# Patient Record
Sex: Male | Born: 1960 | Race: White | Hispanic: No | Smoking: Current every day smoker
Health system: Southern US, Community
[De-identification: ages and names within clinical notes are randomized; demographics above are authoritative.]

## PROBLEM LIST (undated history)

## (undated) DIAGNOSIS — M5126 Other intervertebral disc displacement, lumbar region: Secondary | ICD-10-CM

## (undated) DIAGNOSIS — J329 Chronic sinusitis, unspecified: Secondary | ICD-10-CM

## (undated) HISTORY — DX: Other intervertebral disc displacement, lumbar region: M51.26

## (undated) HISTORY — DX: Chronic sinusitis, unspecified: J32.9

## (undated) HISTORY — PX: NO PAST SURGERIES: SHX2092

---

## 1988-02-12 DIAGNOSIS — M5126 Other intervertebral disc displacement, lumbar region: Secondary | ICD-10-CM

## 1988-02-12 HISTORY — DX: Other intervertebral disc displacement, lumbar region: M51.26

## 2005-08-03 ENCOUNTER — Emergency Department: Payer: Self-pay | Admitting: Unknown Physician Specialty
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
## 2007-12-01 ENCOUNTER — Inpatient Hospital Stay: Payer: Self-pay | Admitting: Psychiatry

## 2010-03-16 ENCOUNTER — Emergency Department: Payer: Self-pay | Admitting: Emergency Medicine

## 2010-03-18 ENCOUNTER — Emergency Department: Payer: Self-pay | Admitting: Emergency Medicine

## 2010-06-10 ENCOUNTER — Emergency Department: Payer: Self-pay | Admitting: Emergency Medicine

## 2011-01-19 ENCOUNTER — Emergency Department: Payer: Self-pay | Admitting: Emergency Medicine
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
## 2017-02-13 ENCOUNTER — Ambulatory Visit: Payer: Self-pay | Admitting: Family Medicine

## 2017-02-13 VITALS — BP 141/78 | HR 88 | Temp 98.5°F | Wt 207.4 lb

## 2017-02-13 DIAGNOSIS — H538 Other visual disturbances: Secondary | ICD-10-CM

## 2017-02-13 DIAGNOSIS — H3530 Unspecified macular degeneration: Secondary | ICD-10-CM

## 2017-02-13 DIAGNOSIS — R030 Elevated blood-pressure reading, without diagnosis of hypertension: Secondary | ICD-10-CM

## 2017-02-13 NOTE — Progress Notes (Signed)
  Subjective:   Phillip Mccard. is a 57 y.o. 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  Past Medical History:  Diagnosis Date  . Lumbar herniated disc 1990  . Recurrent sinusitis     Past Surgical History:  Procedure Laterality Date  . NO PAST SURGERIES      Family History  Problem Relation Age of Onset  . Cancer Maternal Grandmother   . Diabetes Maternal Grandfather   . Epilepsy Son   . Diabetes Mother 59    Social History   Socioeconomic History  . Marital status: Divorced    Spouse name: Not on file  . Number of children: 4  . Years of education: Not on file  . Highest education level: Not on file  Social Needs  . Financial resource strain: Not on file  . Food insecurity - worry: Not on file  . Food insecurity - inability: Not on file  . Transportation needs - medical: Not on file  . Transportation needs - non-medical: Not on file  Occupational History  . Occupation: Temp Job  Tobacco Use  . Smoking status: Current Every Day Smoker    Packs/day: 1.00    Years: 45.00    Pack years: 45.00    Types: Cigarettes    Start date: 02/14/1972  . Smokeless tobacco: Never Used  Substance and Sexual Activity  . Alcohol use: No    Frequency: Never  . Drug use: No  . Sexual activity: Not Currently  Other Topics Concern  . Not on file  Social History Narrative  . Not on file     Objective:  BP (!) 141/78   Pulse 88   Temp 98.5 F (36.9 C)   Wt 207 lb 6.4 oz (94.1 kg)   Gen:  57 y.o. male in NAD HEENT: NCAT, MMM, EOMI, PERRL, anicteric sclerae CV: RRR, no MRG Resp: Non-labored, CTAB, no wheezes noted Ext: WWP, no edema MSK: Gait  intact, no obvious deformities Neuro: Alert and oriented, speech normal      Assessment & Plan:     Phillip Wu. is a 57 y.o. male here for establishing care  1. Elevated BP without diagnosis of hypertension - likely some degree of white coat syndrome in new setting - will continue to monitor - f/u in 6 months to recheck  2. Macular degeneration of left eye, unspecified type - will get him scheduled for Optho appt to reassess  3. Blurry vision, bilateral - Optho appt as above    Bacigalupo, Dionne Bucy, MD MPH  [facility]

## 2017-02-19 ENCOUNTER — Ambulatory Visit: Payer: Self-pay | Admitting: Ophthalmology

## 2017-08-19 ENCOUNTER — Ambulatory Visit: Payer: Self-pay

## 2017-09-09 ENCOUNTER — Ambulatory Visit: Payer: Self-pay

## 2018-01-14 ENCOUNTER — Telehealth: Payer: Self-pay | Admitting: Ophthalmology

## 2018-01-14 NOTE — Telephone Encounter (Signed)
Left VM about rescheduled eye appt on 02/19/2018 with Jefferson Fuel

## 2018-02-18 ENCOUNTER — Ambulatory Visit: Payer: Self-pay | Admitting: Ophthalmology

## 2018-02-19 ENCOUNTER — Ambulatory Visit: Payer: Self-pay | Admitting: Ophthalmology

## 2021-07-01 ENCOUNTER — Emergency Department: Payer: Self-pay

## 2021-07-01 ENCOUNTER — Inpatient Hospital Stay
Admission: EM | Admit: 2021-07-01 | Discharge: 2021-07-12 | DRG: 951 | Disposition: E | Payer: Self-pay | Attending: Internal Medicine | Admitting: Internal Medicine

## 2021-07-01 ENCOUNTER — Other Ambulatory Visit: Payer: Self-pay

## 2021-07-01 ENCOUNTER — Inpatient Hospital Stay: Payer: Self-pay

## 2021-07-01 DIAGNOSIS — C220 Liver cell carcinoma: Secondary | ICD-10-CM | POA: Diagnosis present

## 2021-07-01 DIAGNOSIS — Z66 Do not resuscitate: Secondary | ICD-10-CM | POA: Diagnosis present

## 2021-07-01 DIAGNOSIS — Z809 Family history of malignant neoplasm, unspecified: Secondary | ICD-10-CM

## 2021-07-01 DIAGNOSIS — C7802 Secondary malignant neoplasm of left lung: Secondary | ICD-10-CM | POA: Diagnosis present

## 2021-07-01 DIAGNOSIS — N179 Acute kidney failure, unspecified: Secondary | ICD-10-CM | POA: Diagnosis present

## 2021-07-01 DIAGNOSIS — K767 Hepatorenal syndrome: Secondary | ICD-10-CM | POA: Diagnosis present

## 2021-07-01 DIAGNOSIS — R9431 Abnormal electrocardiogram [ECG] [EKG]: Secondary | ICD-10-CM | POA: Diagnosis present

## 2021-07-01 DIAGNOSIS — R0602 Shortness of breath: Secondary | ICD-10-CM | POA: Diagnosis present

## 2021-07-01 DIAGNOSIS — K4090 Unilateral inguinal hernia, without obstruction or gangrene, not specified as recurrent: Secondary | ICD-10-CM | POA: Diagnosis present

## 2021-07-01 DIAGNOSIS — C7800 Secondary malignant neoplasm of unspecified lung: Secondary | ICD-10-CM | POA: Diagnosis present

## 2021-07-01 DIAGNOSIS — K7031 Alcoholic cirrhosis of liver with ascites: Secondary | ICD-10-CM | POA: Diagnosis present

## 2021-07-01 DIAGNOSIS — Z6826 Body mass index (BMI) 26.0-26.9, adult: Secondary | ICD-10-CM

## 2021-07-01 DIAGNOSIS — Z515 Encounter for palliative care: Principal | ICD-10-CM

## 2021-07-01 DIAGNOSIS — C7801 Secondary malignant neoplasm of right lung: Secondary | ICD-10-CM | POA: Diagnosis present

## 2021-07-01 DIAGNOSIS — F1011 Alcohol abuse, in remission: Secondary | ICD-10-CM | POA: Diagnosis present

## 2021-07-01 DIAGNOSIS — R627 Adult failure to thrive: Secondary | ICD-10-CM | POA: Diagnosis present

## 2021-07-01 DIAGNOSIS — Z20822 Contact with and (suspected) exposure to covid-19: Secondary | ICD-10-CM | POA: Diagnosis present

## 2021-07-01 DIAGNOSIS — F17210 Nicotine dependence, cigarettes, uncomplicated: Secondary | ICD-10-CM | POA: Diagnosis present

## 2021-07-01 DIAGNOSIS — Z820 Family history of epilepsy and other diseases of the nervous system: Secondary | ICD-10-CM

## 2021-07-01 DIAGNOSIS — R64 Cachexia: Secondary | ICD-10-CM | POA: Diagnosis present

## 2021-07-01 DIAGNOSIS — Z833 Family history of diabetes mellitus: Secondary | ICD-10-CM

## 2021-07-01 LAB — BASIC METABOLIC PANEL
Anion gap: 18 — ABNORMAL HIGH (ref 5–15)
BUN: 62 mg/dL — ABNORMAL HIGH (ref 8–23)
CO2: 24 mmol/L (ref 22–32)
Calcium: 6.4 mg/dL — CL (ref 8.9–10.3)
Chloride: 95 mmol/L — ABNORMAL LOW (ref 98–111)
Creatinine, Ser: 2.46 mg/dL — ABNORMAL HIGH (ref 0.61–1.24)
GFR, Estimated: 29 mL/min — ABNORMAL LOW (ref >60)
Glucose, Bld: 103 mg/dL — ABNORMAL HIGH (ref 70–99)
Potassium: 4.1 mmol/L (ref 3.5–5.1)
Sodium: 137 mmol/L (ref 135–145)

## 2021-07-01 LAB — ACETAMINOPHEN LEVEL: Acetaminophen (Tylenol), Serum: <10 ug/mL — ABNORMAL LOW (ref 10–30)

## 2021-07-01 LAB — TYPE AND SCREEN
ABO/RH(D): O POS
Antibody Screen: NEGATIVE

## 2021-07-01 LAB — CBC WITH DIFFERENTIAL/PLATELET
Abs Immature Granulocytes: 0.14 10*3/uL — ABNORMAL HIGH (ref 0.00–0.07)
Basophils Absolute: 0 10*3/uL (ref 0.0–0.1)
Basophils Relative: 0 %
Eosinophils Absolute: 0 10*3/uL (ref 0.0–0.5)
Eosinophils Relative: 0 %
HCT: 32.9 % — ABNORMAL LOW (ref 39.0–52.0)
Hemoglobin: 10.5 g/dL — ABNORMAL LOW (ref 13.0–17.0)
Immature Granulocytes: 1 %
Lymphocytes Relative: 7 %
Lymphs Abs: 0.7 10*3/uL (ref 0.7–4.0)
MCH: 31.7 pg (ref 26.0–34.0)
MCHC: 31.9 g/dL (ref 30.0–36.0)
MCV: 99.4 fL (ref 80.0–100.0)
Monocytes Absolute: 0.9 10*3/uL (ref 0.1–1.0)
Monocytes Relative: 8 %
Neutro Abs: 9.4 10*3/uL — ABNORMAL HIGH (ref 1.7–7.7)
Neutrophils Relative %: 84 %
Platelets: 289 10*3/uL (ref 150–400)
RBC: 3.31 MIL/uL — ABNORMAL LOW (ref 4.22–5.81)
RDW: 17.2 % — ABNORMAL HIGH (ref 11.5–15.5)
Smear Review: NORMAL
WBC Morphology: INCREASED
WBC: 11.2 10*3/uL — ABNORMAL HIGH (ref 4.0–10.5)
nRBC: 0.3 % — ABNORMAL HIGH (ref 0.0–0.2)

## 2021-07-01 LAB — LACTIC ACID, PLASMA
Lactic Acid, Venous: 3.7 mmol/L (ref 0.5–1.9)
Lactic Acid, Venous: 3.4 mmol/L (ref 0.5–1.9)

## 2021-07-01 LAB — PROTIME-INR
INR: 1.3 — ABNORMAL HIGH (ref 0.8–1.2)
Prothrombin Time: 16.1 seconds — ABNORMAL HIGH (ref 11.4–15.2)

## 2021-07-01 LAB — HEPATIC FUNCTION PANEL
ALT: 42 U/L (ref 0–44)
AST: 318 U/L — ABNORMAL HIGH (ref 15–41)
Albumin: 2 g/dL — ABNORMAL LOW (ref 3.5–5.0)
Alkaline Phosphatase: 547 U/L — ABNORMAL HIGH (ref 38–126)
Bilirubin, Direct: 6.6 mg/dL — ABNORMAL HIGH (ref 0.0–0.2)
Indirect Bilirubin: 5.2 mg/dL — ABNORMAL HIGH (ref 0.3–0.9)
Total Bilirubin: 11.8 mg/dL — ABNORMAL HIGH (ref 0.3–1.2)
Total Protein: 5.9 g/dL — ABNORMAL LOW (ref 6.5–8.1)

## 2021-07-01 LAB — RESP PANEL BY RT-PCR (FLU A&B, COVID) ARPGX2
Influenza A by PCR: NEGATIVE
Influenza B by PCR: NEGATIVE
SARS Coronavirus 2 by RT PCR: NEGATIVE

## 2021-07-01 LAB — AMMONIA: Ammonia: 36 umol/L — ABNORMAL HIGH (ref 9–35)

## 2021-07-01 LAB — LIPASE, BLOOD: Lipase: 181 U/L — ABNORMAL HIGH (ref 11–51)

## 2021-07-01 LAB — ETHANOL: Alcohol, Ethyl (B): <10 mg/dL (ref <10)

## 2021-07-01 LAB — TROPONIN I (HIGH SENSITIVITY)
Troponin I (High Sensitivity): 16 ng/L (ref <18)
Troponin I (High Sensitivity): 16 ng/L (ref <18)

## 2021-07-01 MED ORDER — ONDANSETRON HCL 4 MG/2ML IJ SOLN: 4.0000 mg | Freq: Four times a day (QID) | INTRAMUSCULAR | Status: DC | PRN

## 2021-07-01 MED ORDER — LORAZEPAM 2 MG/ML PO CONC: 1.0000 mg | ORAL | Status: DC | PRN

## 2021-07-01 MED ORDER — ACETAMINOPHEN 325 MG PO TABS: 650.0000 mg | ORAL_TABLET | Freq: Four times a day (QID) | ORAL | Status: DC | PRN

## 2021-07-01 MED ORDER — ONDANSETRON HCL 4 MG/2ML IJ SOLN
4.0000 mg | Freq: Once | INTRAMUSCULAR | Status: AC
  Administered 2021-07-01: 4 mg via INTRAVENOUS
  Filled 2021-07-01: qty 2

## 2021-07-01 MED ORDER — SODIUM CHLORIDE 0.9 % IV SOLN
12.5000 mg | INTRAVENOUS | Status: DC | PRN
  Administered 2021-07-02: 12.5 mg via INTRAVENOUS
  Filled 2021-07-01: qty 0.5

## 2021-07-01 MED ORDER — LORAZEPAM 1 MG PO TABS
1.0000 mg | ORAL_TABLET | ORAL | Status: DC | PRN
Start: 2021-07-01 — End: 2021-07-03

## 2021-07-01 MED ORDER — NYSTATIN 100000 UNIT/GM EX POWD: Freq: Three times a day (TID) | CUTANEOUS | Status: DC | PRN

## 2021-07-01 MED ORDER — ONDANSETRON 4 MG PO TBDP: 4.0000 mg | ORAL_TABLET | Freq: Four times a day (QID) | ORAL | Status: DC | PRN

## 2021-07-01 MED ORDER — LORAZEPAM 2 MG/ML IJ SOLN
1.0000 mg | INTRAMUSCULAR | Status: DC | PRN
  Administered 2021-07-01: 1 mg via INTRAVENOUS
  Filled 2021-07-01: qty 1

## 2021-07-01 MED ORDER — ACETAMINOPHEN 650 MG RE SUPP: 650.0000 mg | Freq: Four times a day (QID) | RECTAL | Status: DC | PRN

## 2021-07-01 MED ORDER — DIPHENHYDRAMINE HCL 50 MG/ML IJ SOLN: 12.5000 mg | INTRAMUSCULAR | Status: DC | PRN

## 2021-07-01 MED ORDER — BISACODYL 10 MG RE SUPP
10.0000 mg | Freq: Every day | RECTAL | Status: DC | PRN
  Filled 2021-07-01: qty 1

## 2021-07-01 MED ORDER — HALOPERIDOL LACTATE 2 MG/ML PO CONC: 0.5000 mg | ORAL | Status: DC | PRN

## 2021-07-01 MED ORDER — GLYCOPYRROLATE 1 MG PO TABS
1.0000 mg | ORAL_TABLET | ORAL | Status: DC | PRN
  Filled 2021-07-01: qty 1

## 2021-07-01 MED ORDER — HALOPERIDOL 0.5 MG PO TABS: 0.5000 mg | ORAL_TABLET | ORAL | Status: DC | PRN

## 2021-07-01 MED ORDER — GLYCOPYRROLATE 0.2 MG/ML IJ SOLN
0.2000 mg | INTRAMUSCULAR | Status: DC | PRN
  Filled 2021-07-01: qty 1

## 2021-07-01 MED ORDER — CHLORPROMAZINE HCL 25 MG PO TABS
25.0000 mg | ORAL_TABLET | Freq: Four times a day (QID) | ORAL | Status: DC | PRN
  Filled 2021-07-01: qty 1

## 2021-07-01 MED ORDER — FENTANYL CITRATE PF 50 MCG/ML IJ SOSY: 25.0000 ug | PREFILLED_SYRINGE | INTRAMUSCULAR | Status: DC | PRN

## 2021-07-01 MED ORDER — HALOPERIDOL LACTATE 5 MG/ML IJ SOLN: 0.5000 mg | INTRAMUSCULAR | Status: DC | PRN

## 2021-07-01 MED ORDER — ONDANSETRON HCL 4 MG/2ML IJ SOLN
4.0000 mg | Freq: Four times a day (QID) | INTRAMUSCULAR | Status: DC | PRN
  Administered 2021-07-01 – 2021-07-02 (×2): 4 mg via INTRAVENOUS
  Filled 2021-07-01 (×2): qty 2

## 2021-07-01 MED ORDER — SODIUM CHLORIDE 0.9 % IV SOLN
12.5000 mg | Freq: Four times a day (QID) | INTRAVENOUS | Status: DC | PRN
  Administered 2021-07-01: 12.5 mg via INTRAVENOUS
  Filled 2021-07-01: qty 0.5

## 2021-07-01 MED ORDER — BIOTENE DRY MOUTH MT LIQD: 15.0000 mL | OROMUCOSAL | Status: DC | PRN

## 2021-07-01 MED ORDER — ONDANSETRON HCL 4 MG PO TABS: 4.0000 mg | ORAL_TABLET | Freq: Four times a day (QID) | ORAL | Status: DC | PRN

## 2021-07-01 MED ORDER — ACETAMINOPHEN 325 MG RE SUPP: 650.0000 mg | Freq: Four times a day (QID) | RECTAL | Status: DC | PRN

## 2021-07-01 MED ORDER — POLYVINYL ALCOHOL 1.4 % OP SOLN: 1.0000 [drp] | Freq: Four times a day (QID) | OPHTHALMIC | Status: DC | PRN

## 2021-07-01 MED ORDER — LORAZEPAM 2 MG/ML IJ SOLN
2.0000 mg | INTRAMUSCULAR | Status: DC | PRN
  Administered 2021-07-02: 2 mg via INTRAVENOUS
  Filled 2021-07-01: qty 1

## 2021-07-01 MED ORDER — ALUM & MAG HYDROXIDE-SIMETH 200-200-20 MG/5ML PO SUSP: 30.0000 mL | Freq: Four times a day (QID) | ORAL | Status: DC | PRN

## 2021-07-01 MED ORDER — MORPHINE 100MG IN NS 100ML (1MG/ML) PREMIX INFUSION: 1.0000 mg/h | INTRAVENOUS | Status: DC | PRN

## 2021-07-01 MED ORDER — MORPHINE SULFATE (PF) 2 MG/ML IV SOLN
2.0000 mg | INTRAVENOUS | Status: DC | PRN
  Administered 2021-07-01 – 2021-07-02 (×2): 2 mg via INTRAVENOUS
  Filled 2021-07-01 (×2): qty 1

## 2021-07-01 MED ORDER — GLYCOPYRROLATE 0.2 MG/ML IJ SOLN
0.2000 mg | INTRAMUSCULAR | Status: DC | PRN
  Administered 2021-07-02: 0.2 mg via INTRAVENOUS
  Filled 2021-07-01 (×2): qty 1

## 2021-07-01 NOTE — Assessment & Plan Note (Addendum)
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  Per admitting hospitalist: "After discussion extensively with patient at bedside regarding his diagnosis, poor prognosis, and the emergency department, patient has decided to proceed with comfort care only"  - Comfort care order set utilized --Continue morphine infusion, titrate for comfort --Otherwise comfort care per orders including as needed Ativan, Haldol, Robinul, Thorazine for hiccups, IV antiemetics --Notify provider if any signs of distress discomfort or pain despite current medications - RN can pronounce death

## 2021-07-01 NOTE — ED Triage Notes (Signed)
Pt presents via POV c/o pain all over, abd swelling, jaundice. Pt lives in shed type building. Reports no food x1 week. Reports progressive swelling to abd. Report hx ETOH abuse.

## 2021-07-01 NOTE — ED Notes (Signed)
Pharmacy called to send phenergan IVPB.

## 2021-07-01 NOTE — ED Provider Notes (Signed)
[facility] Provider Note    Event Date/Time   First MD Initiated Contact with Patient 06/12/2021 1626     (approximate)   History   Jaundice and Hypotension   HPI  Phillip Wu. is a 61 y.o. male reports long-term use of EtOH but recently stopped 2 to 3 months ago who comes in with concerns for abdominal swelling and jaundice.  Patient reports that he has been living outside of somebody's house in a shed.  He reports calling EMS today due to feeling more short of breath.  He reports having increasing swelling of his abdomen for the past few weeks as well as some yellowing of his skin.      Physical Exam   Triage Vital Signs: Blood pressure 106/78, pulse 94, resp. rate 20, SpO2 93 %.   Most recent vital signs: Vitals:   06/30/2021 1637 06/30/2021 1700  BP: 96/67 106/78  Pulse: 94 94  Resp: (!) 26 20  SpO2: 93%      General: Awake, no distress.  CV:  Good peripheral perfusion.  Resp:  Normal effort.  Placed on 2 L for an oxygen of 89% Abd:  Significantly distended Other:  Rectal exam is brown stool Hemoccult positive Significant scleral icterus  ED Results / Procedures / Treatments   Labs (all labs ordered are listed, but only abnormal results are displayed) Labs Reviewed  CBC WITH DIFFERENTIAL/PLATELET - Abnormal; Notable for the following components:      Result Value   WBC 11.2 (*)    RBC 3.31 (*)    Hemoglobin 10.5 (*)    HCT 32.9 (*)    RDW 17.2 (*)    nRBC 0.3 (*)    Neutro Abs 9.4 (*)    Abs Immature Granulocytes 0.14 (*)    All other components within normal limits  BASIC METABOLIC PANEL - Abnormal; Notable for the following components:   Chloride 95 (*)    Glucose, Bld 103 (*)    BUN 62 (*)    Creatinine, Ser 2.46 (*)    Calcium 6.4 (*)    GFR, Estimated 29 (*)    Anion gap 18 (*)    All other components within normal limits  HEPATIC FUNCTION PANEL - Abnormal; Notable for the following components:   Total Protein  5.9 (*)    Albumin 2.0 (*)    AST 318 (*)    Alkaline Phosphatase 547 (*)    Total Bilirubin 11.8 (*)    Bilirubin, Direct 6.6 (*)    Indirect Bilirubin 5.2 (*)    All other components within normal limits  LIPASE, BLOOD - Abnormal; Notable for the following components:   Lipase 181 (*)    All other components within normal limits  PROTIME-INR - Abnormal; Notable for the following components:   Prothrombin Time 16.1 (*)    INR 1.3 (*)    All other components within normal limits  AMMONIA - Abnormal; Notable for the following components:   Ammonia 36 (*)    All other components within normal limits  LACTIC ACID, PLASMA - Abnormal; Notable for the following components:   Lactic Acid, Venous 3.7 (*)    All other components within normal limits  ACETAMINOPHEN LEVEL - Abnormal; Notable for the following components:   Acetaminophen (Tylenol), Serum <10 (*)    All other components within normal limits  RESP PANEL BY RT-PCR (FLU A&B, COVID) ARPGX2  CULTURE, BLOOD (ROUTINE X 2)  CULTURE, BLOOD (ROUTINE  X 2)  ETHANOL  URINALYSIS, ROUTINE W REFLEX MICROSCOPIC  URINE DRUG SCREEN, QUALITATIVE ([facility] ONLY)  LACTIC ACID, PLASMA  HEPATITIS PANEL, ACUTE  TYPE AND SCREEN  TROPONIN I (HIGH SENSITIVITY)  TROPONIN I (HIGH SENSITIVITY)     EKG  My interpretation of EKG:  Normal sinus rate of 94 without any ST elevation or T wave inversions, normal intervals  RADIOLOGY I have reviewed the xray personally and interpreted I do not see any evidence of pneumonia or pleural effusions  PROCEDURES:  Critical Care performed: Yes, see critical care procedure note(s)  .Critical Care Performed by: Vanessa Kingman, MD Authorized by: Vanessa Racine, MD   Critical care provider statement:    Critical care time (minutes):  30   Critical care was necessary to treat or prevent imminent or life-threatening deterioration of the following conditions:  Respiratory failure   Critical care was time spent  personally by me on the following activities:  Development of treatment plan with patient or surrogate, discussions with consultants, evaluation of patient's response to treatment, examination of patient, ordering and review of laboratory studies, ordering and review of radiographic studies, ordering and performing treatments and interventions, pulse oximetry, re-evaluation of patient's condition and review of old charts .1-3 Lead EKG Interpretation Performed by: Vanessa Stokes, MD Authorized by: Vanessa Holland, MD     Interpretation: normal     ECG rate:  90   ECG rate assessment: normal     Rhythm: sinus rhythm     Ectopy: none     Conduction: normal     MEDICATIONS ORDERED IN ED: Medications  ondansetron (ZOFRAN) injection 4 mg (4 mg Intravenous Given 06/13/2021 1719)     IMPRESSION / MDM / ASSESSMENT AND PLAN / ED COURSE  I reviewed the triage vital signs and the nursing notes.   Exam with concern for acute life-threatening pathology such as differential: decompensated liver cirrhosis vs chf vs ACS.  Doubt SBP given no abdominal tenderness, normal white count, normal fever  CBC shows hemoglobin of 10.5 and no melena on exam so no evidence of GI bleed. Ammonia only slightly elevated he does not appear confused COVID, flu negative Lactate slightly elevated but hold off on fluids due to my concern for this just being elevated secondary to liver decompensation.  His blood pressures and vitals are normal  Patient is low calcium but is corrected at 7.6 for his low albumin.  His LFTs are significantly elevated concerning for cirrhosis  INR is only 1.3  Troponin negative  I have let Dr. Allen Norris know who reported that they can just consult GI tomorrow.    Patient will need large-volume para-I have asked interventional radiology who states that they would have to wait till tomorrow given this the weekend and the medicine team can consult in place ordered tomorrow and they will be able to do  it this was Dr. Earleen Newport I talked to   7:14 PM updated patient on the concern of the CT showing possible mets to the liver could be liver cancer.  I will discuss with hospital team for admission due to my concern for decompensated liver cirrhosis   The patient is on the cardiac monitor to evaluate for evidence of arrhythmia and/or significant heart rate changes.      FINAL CLINICAL IMPRESSION(S) / ED DIAGNOSES   Final diagnoses:  AKI (acute kidney injury) ([place])  Alcoholic cirrhosis of liver with ascites ([place])     Rx / DC Orders  ED Discharge Orders     None        Note:  This document was prepared using Dragon voice recognition software and may include unintentional dictation errors.   Vanessa [place], MD 06/24/2021 [phone]

## 2021-07-01 NOTE — H&P (Addendum)
History and Physical   Nance Pear. VVO:[id number] DOB: [birth year] DOA: 06/18/2021  PCP: Pcp, No  Patient coming from: Home via [place]  I have personally briefly reviewed patient's old medical records in [place].  Chief Concern: Pain all over, swelling of his abdomen and jaundice  HPI: Mr. Phillip Wu is a 61 year old male with history of alcohol abuse and dependence, states he has not had EtOH in 3 months, who presents emergency department for chief concerns of jaundice, shortness of breath, abdominal distention.  Note: Patient has not been to a doctor in over 30 years.  He states that every time a family comes to the hospital, they always die therefore he does not go to the hospital.  Initial vitals in the emergency department showed temperature of 97.9, respiration rate of 20, heart rate of 95, blood pressure initially 106/78, SPO2 of 93% on 2 L nasal cannula.  Serum sodium is 137, potassium 3.1, chloride of 95, bicarb 24, BUN of 62, serum creatinine of 2.46, GFR of 29, nonfasting blood glucose 103, WBC 11.2, hemoglobin 10.5, platelets of 2 89.  Ammonia level was elevated at 36.  Lipase was 181.  Direct bili 6.6, indirect bili is 5.2, alk phosphatase was 547, AST 318, ALT 42.  Albumin level is 2.0.  Lactic acid was elevated at 3.7.  High sensitive troponin was 16.  CT abdomen pelvis without contrast was read as numerous masses throughout the liver, some of which show partial calcification.  Differential diagnosis include liver metastasis and multifocal hepatocellular carcinoma.  Large amount of ascites, small left inguinal hernia, also containing ascites.  Numerous small pulmonary metastasis in both lung base.  ED treatment: Ondansetron 4 mg IV  At bedside he is able to tell me his full name, his age, the current calendar year and the current location.  He states that his belly swelling has been ongoing for the last several months.  He endorses significant weight loss,  is unsure of the specific amount.  He states several months ago he quit drinking alcohol because "God looked down on me and said that if I drank alcohol 1 more time I will die.  So I stopped "  He denies fever, chest pain, dysuria, diarrhea. He endorses dyspnea, nausea, vomiting.  Social history: He lives in a shed behind a friend's house.  He is a former EtOH user, drinking "what ever I can get my hands on, mostly vodka.  I drink more than anybody should "  ROS: Constitutional: no weight change, no fever ENT/Mouth: no sore throat, no rhinorrhea Eyes: no eye pain, no vision changes Cardiovascular: no chest pain, + dyspnea,  no edema, no palpitations Respiratory: no cough, no sputum, no wheezing Gastrointestinal: + nausea, no vomiting, no diarrhea, no constipation Genitourinary: no urinary incontinence, no dysuria, no hematuria Musculoskeletal: no arthralgias, no myalgias Skin: no skin lesions, no pruritus, Neuro: + weakness, no loss of consciousness, no syncope Psych: no anxiety, no depression, + decreased appetite Heme/Lymph: no bruising, no bleeding  ED Course: Discussed with emergency medicine provider, patient requiring hospitalization for chief concerns of multiple organ failure, likely end-of-life care.  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 - After discussion extensively with patient at  bedside regarding his diagnosis, poor prognosis, and the emergency department, patient has decided to proceed with comfort care only - Comfort care order set utilized - No antibiotics or IVF as per family's request - As needed medications include pain control with morphine as needed, would transition to a drip if needed but patient does not appear to be in pain at this time - RN can pronounce death  Social discussion: I ask patient if he would like me to call any family members.  He stated multiple times no.  He states that he had a younger son who died coded and died.  His remaining children do not talk to him.  He does not want his children to be called.  He requested that I call his landlord/friend Luellen Pucker at [phone].  He requested that I let Luellen Pucker know that he will not be coming back home.  He requested that I call his friend Raberto at [phone] to let his friend know.  He states he would like [place] and/or Raberto to visit him if they are able to.  Chart reviewed.   DVT prophylaxis: TED hose, comfort care Code Status: DNR/DNI Diet: Heart healthy Friend Communication: As above Disposition Plan: anticipate in hospital death Consults called: TOC Admission status: Admit - It is my clinical opinion that admission to INPATIENT is reasonable and necessary because of the expectation that this patient will require hospital care that crosses at least 2 midnights to treat this condition based on the medical complexity of the problems presented.  Given the aforementioned information, the predictability of an adverse outcome is felt to be significant.  Past Medical History:  Diagnosis Date   Lumbar herniated disc 1990   Recurrent sinusitis    Past Surgical History:  Procedure Laterality Date   NO PAST SURGERIES     Social History:  reports that he has been smoking cigarettes. He started smoking about 49 years ago. He has a 45.00 pack-year smoking history. He has never used smokeless  tobacco. He reports that he does not drink alcohol and does not use drugs.  No Known Allergies Family History  Problem Relation Age of Onset   Cancer Maternal Grandmother    Diabetes Maternal Grandfather    Epilepsy Son    Diabetes Mother 53   Family history: Family history reviewed and not pertinent.  Prior to Admission medications   Not on File   Physical Exam: Vitals:   06/27/2021 1700 07/04/2021 1730 06/29/2021 1816 06/17/2021 1910  BP: 106/78 101/72  98/65  Pulse: 94 97  (!) 102  Resp: 20 (!) 25  (!) 25  Temp:   97.9 F (36.6 C)   TempSrc:   Oral   SpO2:  98%  92%   Constitutional: appears older than chronological age, cachectic, actively dying Eyes: PERRL, scleral icterus bilaterally ENMT: Mucous membranes are moist. Posterior pharynx clear of any exudate or lesions. Age-appropriate dentition. Hearing appropriate Neck: normal, supple, no masses, no thyromegaly Respiratory: clear to auscultation bilaterally, no wheezing, no crackles. Normal respiratory effort. No accessory muscle use.  Cardiovascular: Regular rate and rhythm, no murmurs / rubs / gallops. No extremity edema. 2+ pedal pulses. No carotid bruits.  Abdomen: Distended abdomen with positive fluid wave, generalized diffuse tenderness.  Bowel sounds cannot be appreciated with auscultation. Musculoskeletal: no clubbing / cyanosis. No joint deformity upper and lower extremities.  Generalized decreased muscle tone in upper and lower extremities Skin: Jaundice, negative for lesions Neurologic: Sensation intact. Strength 5/5 in all 4.  Psychiatric: Normal judgment and insight. Alert and oriented x 3.  Depressed mood.   EKG: independently reviewed, showing sinus rhythm 94, QTc 552  Chest x-ray on Admission: I personally reviewed and I agree with radiologist reading as below.  CT ABDOMEN PELVIS WO CONTRAST  Result Date: 06/23/2021 CLINICAL DATA:  Abdominal pain and swelling.  Jaundice. * Tracking Code: BO * EXAM: CT ABDOMEN  AND PELVIS WITHOUT CONTRAST TECHNIQUE: Multidetector CT imaging of the abdomen and pelvis was performed following the standard protocol without IV contrast. RADIATION DOSE REDUCTION: This exam was performed according to the departmental dose-optimization program which includes automated exposure control, adjustment of the mA and/or kV according to patient size and/or use of iterative reconstruction technique. COMPARISON:  None Available. FINDINGS: Lower chest: Numerous small pulmonary nodules are seen in visualized portions of both lung bases, consistent with pulmonary metastases. Hepatobiliary: Numerous low-attenuation masses are seen throughout the liver, some of which show partial calcification. Differential diagnosis includes liver metastases and multifocal hepatocellular carcinoma. Gallbladder is unremarkable. No evidence of biliary ductal dilatation. Pancreas: No mass or inflammatory process visualized on this unenhanced exam. Spleen:  Within normal limits in size. Adrenals/Urinary tract: No evidence of urolithiasis or hydronephrosis. Unremarkable unopacified urinary bladder. Stomach/Bowel: No evidence of obstruction, inflammatory process, or abnormal fluid collections. Vascular/Lymphatic: No pathologically enlarged lymph nodes identified. No evidence of abdominal aortic aneurysm. Aortic atherosclerotic calcification incidentally noted. Reproductive:  No mass or other significant abnormality. Other: Large amount of ascites is seen throughout the abdomen and pelvis. A small left inguinal hernia is also seen containing ascites. Diffuse body wall edema also noted. Musculoskeletal:  No suspicious bone lesions identified. IMPRESSION: Numerous masses throughout the liver, some of which show partial calcification. Differential diagnosis includes liver metastases and multifocal hepatocellular carcinoma. Large amount of ascites. Small left inguinal hernia also seen which contains ascites. Numerous small pulmonary  metastases in both lung bases. Electronically Signed   By: Marlaine Hind M.D.   On: 06/23/2021 18:12   CT HEAD WO CONTRAST (5MM)  Result Date: 06/26/2021 CLINICAL DATA:  61 year old male with altered mental status. EXAM: CT HEAD WITHOUT CONTRAST TECHNIQUE: Contiguous axial images were obtained from the base of the skull through the vertex without intravenous contrast. RADIATION DOSE REDUCTION: This exam was performed according to the departmental dose-optimization program which includes automated exposure control, adjustment of the mA and/or kV according to patient size and/or use of iterative reconstruction technique. COMPARISON:  08/03/2005 FINDINGS: Brain: No evidence of acute infarction, hemorrhage, hydrocephalus, extra-axial collection or mass lesion/mass effect. Vascular: Carotid atherosclerotic calcifications are noted. Skull: Normal. Negative for fracture or focal lesion. Sinuses/Orbits: No acute finding. Other: None. IMPRESSION: No evidence of acute intracranial abnormality. Electronically Signed   By: Margarette Canada M.D.   On: 06/12/2021 18:14   US Venous Img Lower Bilateral  Result Date: 06/17/2021 CLINICAL DATA:  Leg swelling and pitting edema. EXAM: BILATERAL LOWER EXTREMITY VENOUS DOPPLER ULTRASOUND TECHNIQUE: Gray-scale sonography with graded compression, as well as color Doppler and duplex ultrasound were performed to evaluate the lower extremity deep venous systems from the level of the common femoral vein and including the common femoral, femoral, profunda femoral, popliteal and calf veins  including the posterior tibial, peroneal and gastrocnemius veins when visible. The superficial great saphenous vein was also interrogated. Spectral Doppler was utilized to evaluate flow at rest and with distal augmentation maneuvers in the common femoral, femoral and popliteal veins. COMPARISON:  None Available. FINDINGS: RIGHT LOWER EXTREMITY Common Femoral Vein: No evidence of thrombus. Normal  compressibility, respiratory phasicity and response to augmentation. Saphenofemoral Junction: No evidence of thrombus. Normal compressibility and flow on color Doppler imaging. Profunda Femoral Vein: No evidence of thrombus. Normal compressibility and flow on color Doppler imaging. Femoral Vein: No evidence of thrombus. Normal compressibility, respiratory phasicity and response to augmentation. Popliteal Vein: No evidence of thrombus. Normal compressibility, respiratory phasicity and response to augmentation. Calf Veins: No evidence of thrombus. Normal compressibility and flow on color Doppler imaging. Superficial Great Saphenous Vein: No evidence of thrombus. Normal compressibility. Venous Reflux:  None. Other Findings:  None. LEFT LOWER EXTREMITY Common Femoral Vein: No evidence of thrombus. Normal compressibility, respiratory phasicity and response to augmentation. Saphenofemoral Junction: No evidence of thrombus. Normal compressibility and flow on color Doppler imaging. Profunda Femoral Vein: No evidence of thrombus. Normal compressibility and flow on color Doppler imaging. Femoral Vein: No evidence of thrombus. Normal compressibility, respiratory phasicity and response to augmentation. Popliteal Vein: No evidence of thrombus. Normal compressibility, respiratory phasicity and response to augmentation. Calf Veins: No evidence of thrombus. Normal compressibility and flow on color Doppler imaging. Superficial Great Saphenous Vein: No evidence of thrombus. Normal compressibility. Venous Reflux:  None. Other Findings:  None. IMPRESSION: No evidence of deep venous thrombosis in either lower extremity. Electronically Signed   By: Zerita Boers M.D.   On: 06/11/2021 20:12   DG Chest Portable 1 View  Result Date: 06/27/2021 CLINICAL DATA:  Chest pain and shortness of breath. Abdominal pain and jaundice. EXAM: PORTABLE CHEST 1 VIEW COMPARISON:  03/16/2010 FINDINGS: The heart size and mediastinal contours are within  normal limits. Low lung volumes are seen. Subsegmental atelectasis is noted in the left lung base. No evidence of pulmonary consolidation or pleural effusion. IMPRESSION: Low lung volumes and left basilar subsegmental atelectasis. Electronically Signed   By: Marlaine Hind M.D.   On: 06/19/2021 17:07   US ABDOMEN LIMITED RUQ (LIVER/GB)  Result Date: 06/15/2021 CLINICAL DATA:  Right upper quadrant abdominal pain.  Cirrhosis. EXAM: ULTRASOUND ABDOMEN LIMITED RIGHT UPPER QUADRANT COMPARISON:  CT abdomen pelvis dated 06/21/2021. FINDINGS: Gallbladder: No gallstones or wall thickening visualized. No sonographic Murphy sign noted by sonographer. Common bile duct: Diameter: 4 mm Liver: The liver demonstrates a heterogeneous echotexture with multiple echogenic masses. A confluent of mass in the left lobe measure up to approximately 15 cm. There is lobulated liver contour in keeping with cirrhosis. Portal vein is patent on color Doppler imaging with normal direction of blood flow towards the liver. Other: Small ascites. IMPRESSION: 1. Cirrhosis with evidence of portal hypertension and ascites. 2. Multiple hepatic masses most suspicious for metastasis or hepatocellular carcinoma. 3. Patent main portal vein with hepatopetal flow. Electronically Signed   By: Anner Crete M.D.   On: 06/26/2021 19:17    Labs on Admission: I have personally reviewed following labs  CBC: Recent Labs  Lab 06/30/2021 1632  WBC 11.2*  NEUTROABS 9.4*  HGB 10.5*  HCT 32.9*  MCV 99.4  PLT 209   Basic Metabolic Panel: Recent Labs  Lab 06/11/2021 1632  NA 137  K 4.1  CL 95*  CO2 24  GLUCOSE 103*  BUN 62*  CREATININE 2.46*  CALCIUM 6.4*  GFR: CrCl cannot be calculated (Unknown ideal weight.).  Liver Function Tests: Recent Labs  Lab 06/11/2021 1632  AST 318*  ALT 42  ALKPHOS 547*  BILITOT 11.8*  PROT 5.9*  ALBUMIN 2.0*   Recent Labs  Lab 07/10/2021 1632  LIPASE 181*   Recent Labs  Lab 06/16/2021 1633  AMMONIA  36*   Coagulation Profile: Recent Labs  Lab 06/30/2021 1632  INR 1.3*   CRITICAL CARE Performed by: Briant Cedar Deonta Bomberger  Total critical care time: 35 minutes  Critical care time was exclusive of separately billable procedures and treating other patients.  Critical care was necessary to treat or prevent imminent or life-threatening deterioration. Hepatorenal syndrome, multiple organ failure  Critical care was time spent personally by me on the following activities: development of treatment plan with patient and/or surrogate as well as nursing, discussions with consultants, evaluation of patient's response to treatment, examination of patient, obtaining history from patient or surrogate, ordering and performing treatments and interventions, ordering and review of laboratory studies, ordering and review of radiographic studies, pulse oximetry and re-evaluation of patient's condition.  Dr. Tobie Poet [facility]  If 7PM-7AM, please contact overnight-coverage provider If 7AM-7PM, please contact day coverage provider [email or web address]  06/22/2021, 10:54 PM

## 2021-07-01 NOTE — Hospital Course (Addendum)
Mr. Phillip Wu is a 61 year old male with history of alcohol abuse and dependence, states he has not had EtOH in 3 months, who presented to the ED on 07/05/2021 with progressive symptoms of jaundice, shortness of breath, and abdominal distention.  He reported not being seen by Dr. For over 30 years.  CT of abdomen and pelvis was obtained in the ED for evaluation which showed numerous masses throughout the liver, some of which show partial calcification.  Differential diagnosis includes liver metastases versus multifocal hepatocellular carcinoma.  Large amount of ascites, small left inguinal hernia also containing ascites.  Numerous small pulmonary metastases in both lung bases.  Findings and prognosis were discussed in detail with the patient at the time of admission.  Patient decided that he wanted only comfort care measures.  Of note, patient reported to my colleague Dr. Tobie Poet on admission that he did not want any family members contacted on his behalf.  He only requested that his friend and landlord Phillip Wu be notified.  5/22: Patient doing okay on morphine infusion, intermittently shows signs of pain and discomfort that seem primarily due to his abdominal distention.  Palliative paracentesis requested.

## 2021-07-01 NOTE — ED Notes (Signed)
*  critical Ca  Ca level 6.4 per lab.

## 2021-07-02 LAB — HEPATITIS PANEL, ACUTE
HCV Ab: NONREACTIVE
Hep A IgM: NONREACTIVE
Hep B C IgM: NONREACTIVE
Hepatitis B Surface Ag: NONREACTIVE

## 2021-07-02 MED ORDER — PROCHLORPERAZINE EDISYLATE 10 MG/2ML IJ SOLN
10.0000 mg | Freq: Four times a day (QID) | INTRAMUSCULAR | Status: DC | PRN
  Administered 2021-07-02: 10 mg via INTRAVENOUS
  Filled 2021-07-02 (×2): qty 2

## 2021-07-02 MED ORDER — MORPHINE SULFATE (PF) 2 MG/ML IV SOLN: 2.0000 mg | INTRAVENOUS | Status: DC | PRN

## 2021-07-02 MED ORDER — MORPHINE 100MG IN NS 100ML (1MG/ML) PREMIX INFUSION
1.0000 mg/h | INTRAVENOUS | Status: DC | PRN
  Administered 2021-07-02: 1 mg/h via INTRAVENOUS
  Administered 2021-07-02: 2 mg/h via INTRAVENOUS
  Administered 2021-07-02: 3 mg/h via INTRAVENOUS
  Filled 2021-07-02: qty 100

## 2021-07-02 NOTE — Assessment & Plan Note (Signed)
Has as needed Ativan per comfort care.  This may be used for signs of withdrawal as well.

## 2021-07-02 NOTE — Assessment & Plan Note (Signed)
Comfort care per orders including morphine infusion.

## 2021-07-02 NOTE — Assessment & Plan Note (Signed)
With severe abdominal distention and discomfort related to this.   Palliative paracentesis ordered. No further evaluation per comfort care.

## 2021-07-02 NOTE — Assessment & Plan Note (Signed)
Noted on CT abdomen pelvis in the ED. Patient decided to pursue only comfort measures and no further evaluation.

## 2021-07-02 NOTE — Assessment & Plan Note (Signed)
Presented with creatinine of 2.46.   No baseline available.

## 2021-07-02 NOTE — Progress Notes (Signed)
Progress Note   Patient: Phillip Wu. [id number]:[id number] DOB: 07/17/1960 DOA: 06/23/2021     1 DOS: the patient was seen and examined on 07/02/2021   Brief hospital course: Mr. Phillip Wu is a 61 year old male with history of alcohol abuse and dependence, states he has not had EtOH in 3 months, who presented to the ED on 06/12/2021 with progressive symptoms of jaundice, shortness of breath, and abdominal distention.  He reported not being seen by Dr. For over 30 years.  CT of abdomen and pelvis was obtained in the ED for evaluation which showed numerous masses throughout the liver, some of which show partial calcification.  Differential diagnosis includes liver metastases versus multifocal hepatocellular carcinoma.  Large amount of ascites, small left inguinal hernia also containing ascites.  Numerous small pulmonary metastases in both lung bases.  Findings and prognosis were discussed in detail with the patient at the time of admission.  Patient decided that he wanted only comfort care measures.  Of note, patient reported to my colleague Dr. Tobie Poet on admission that he did not want any family members contacted on his behalf.  He only requested that his friend and landlord Phillip Wu be notified.  5/22: Patient doing okay on morphine infusion, intermittently shows signs of pain and discomfort that seem primarily due to his abdominal distention.  Palliative paracentesis requested.  Assessment and Plan: * End of life care Presented on 5/21 with apparent failure to thrive, hepatorenal syndrome, hepatocellular carcinoma with metastasis to the bilateral lungs, large volume abdominal ascites, etiology was not worked up due to patient requesting comfort care only.  Per admitting hospitalist: "After discussion extensively with patient at bedside regarding his diagnosis, poor prognosis, and the emergency department, patient has decided to proceed with comfort care only"  - Comfort care order set  utilized --Continue morphine infusion, titrate for comfort --Otherwise comfort care per orders including as needed Ativan, Haldol, Robinul, Thorazine for hiccups, IV antiemetics --Notify provider if any signs of distress discomfort or pain despite current medications - RN can pronounce death  Prolonged QT interval No further monitoring required per comfort care.  Hepatorenal syndrome as complication of care ([place]) .  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 Patient decided to pursue only comfort measures and no further evaluation.  History of alcohol abuse Has as needed Ativan per comfort care.  This may be used for signs of withdrawal as well.  Alcoholic cirrhosis of liver with ascites ([place]) With severe abdominal distention and discomfort related to this.   Palliative paracentesis ordered. No further evaluation per comfort care.  AKI (acute kidney injury) ([place]) Presented with creatinine of 2.46.   No baseline available.  Shortness of breath Comfort care per orders including morphine infusion.        Subjective: Patient awake sitting up in bed when seen today.  He is cachectic and mildly sedated but is responsive.  He does speak but only able to mumble.  He appears extremely cachectic with severely distended abdomen.  Currently denies abdominal pain but does feel short of breath.  No acute events reported.  On morphine infusion, needing to be titrated up per bedside RN.  Order parameters were updated.  Physical Exam: Vitals:   06/29/2021 1816 06/30/2021 1910 07/02/21 0554 07/02/21 0900  BP:  98/65 (!) 87/66 92/65  Pulse:  (!) 102 96 100  Resp:  (!) '25 18 20  '$ Temp: 97.9 F (36.6 C)  97.9 F (36.6 C)  97.7 F (36.5 C)  TempSrc: Oral  Oral Axillary  SpO2:  92% (!) 89% 90%   General exam: Cachectic, chronically ill-appearing awake, alert, in mild distress/discomfort HEENT: Icteric sclerae, moist mucus membranes, hearing grossly  normal  Respiratory system: CTAB but shallow inspirations due to abdominal distention, no wheezes, rales or rhonchi, normal respiratory effort. Cardiovascular system: normal S1/S2, RRR, no pedal edema.   Gastrointestinal system: Tensely distended abdomen without significant tenderness. Central nervous system: Does not answer orientation questions but is alert. no gross focal neurologic deficits, speech is mumbling Extremities: moves all, no edema, normal tone Skin: Jaundice, dry, intact, normal temperature Psychiatry: normal mood, congruent affect, judgement and insight appear normal   Data Reviewed:  No new labs to review today  Family Communication: None, per patient's wishes stated at time of admission  Disposition: Status is: Inpatient Remains inpatient appropriate because: On morphine infusion for end-of-life care.  In-hospital death anticipated unless patient remains stable and could be potentially transferred to residential hospice facility   Planned Discharge Destination:  Anticipate hospital death versus residential hospice    Time spent: 40 minutes  Author: Ezekiel Slocumb, DO 07/02/2021 1:33 PM  For on call review [email or web address].

## 2021-07-02 NOTE — Assessment & Plan Note (Signed)
No further monitoring required per comfort care.

## 2021-07-02 NOTE — Progress Notes (Signed)
Nutrition Brief Note  Chart reviewed. Pt now transitioning to comfort care.  No further nutrition interventions planned at this time.  Please re-consult as needed.   Phillip Wu, RD, LDN, CDCES Registered Dietitian II Certified Diabetes Care and Education Specialist Please refer to [facility] for RD and/or RD on-call/weekend/after hours pager   

## 2021-07-03 ENCOUNTER — Inpatient Hospital Stay: Payer: Self-pay

## 2021-07-12 NOTE — Progress Notes (Signed)
   July 15, 2021 0700  Attending [place]  Attending Physician Notified Y  Attending Physician (First and Last Name) Nicole Kindred  Post Mortem Checklist  Date of Death 2021/07/15  Time of Death 0720  Pronounced By Lyman Bishop and Rosalva Ferron RN  Next of kin notified No  Reason next of kin not notified  (Only ocntact in chart is a friend Kathryne Gin, who I did notify)  Was the patient a No Code Blue or a Limited Code Blue? No  Did the patient die unattended? Yes  Patient restrained? Not applicable  Height $Remov'5\' 11"'vgRdnd$  (1.803 m)  Weight 85 kg  Body preparation complete Y  [employer] (previously known as [employer])  Notification Date 2021/07/15  Notification Time 3300  [employer] Number [phone] Estill Bamberg)  Is patient a potential donor? Y  Donation Type Eyes  Eye prep completed Yes  Autopsy  Autopsy requested by N/A  Patient and [place] Returned  Patient is satisfied that all belongings have been returned? Not applicable  Dermatherapy linen/gowns NOT sent with patient or transporter Disposable Patient Transfer/ Apparel Kit used  Dead on Arrival (Emergency Department)  Patient dead on arrival? No  Notifications  Patient Placement notified that Post Mortem checklist is complete Yes  Patient Placement notified body transferred Transported to [place]  Is this a medical examiner's case? [place] location of pickup [facility]

## 2021-07-12 NOTE — Death Summary Note (Signed)
DEATH SUMMARY   Patient Details  Name: Phillip Wu. 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 Phillip Wu is a 61 year old male with history of alcohol abuse and dependence, states he has not had EtOH in 3 months, who presented to the ED on 07-27-2021 with progressive symptoms of jaundice, shortness of breath, and abdominal distention.  He reported not being seen by Dr. For over 30 years.  CT of abdomen and pelvis was obtained in the ED for evaluation which showed numerous masses throughout the liver, some of which show partial calcification.  Differential diagnosis includes liver metastases versus multifocal hepatocellular carcinoma.  Large amount of ascites, small left inguinal hernia also containing ascites.  Numerous small pulmonary metastases in both lung bases.  Findings and prognosis were discussed in detail with the patient at the time of admission.  Patient decided that he wanted only comfort care measures.  Of note, patient reported to my colleague Dr. Tobie Wu on admission that he did not want any family members contacted on his behalf.  He only requested that his friend and landlord Phillip Wu be notified.  5/22: Patient doing okay on morphine infusion, intermittently shows signs of pain and discomfort that seem primarily due to his abdominal distention.  Palliative paracentesis requested.  07-30-22:  Notified by bedside RN early this AM that patient had passed away at 0720.  Assessment and Plan: * End of life care Presented on 07/28/22 with apparent failure to thrive, hepatorenal syndrome, hepatocellular carcinoma with metastasis to the bilateral lungs, large volume abdominal ascites, etiology was not worked up due to patient requesting comfort care only.  Per admitting hospitalist: "After discussion extensively with patient at bedside regarding his diagnosis, poor prognosis, and the emergency department, patient has decided to proceed with comfort care only"  - Comfort care order set utilized --Continue morphine infusion, titrate for comfort --Otherwise comfort care per orders including as needed Ativan, Haldol, Robinul, Thorazine for hiccups, IV antiemetics --Notify provider if any signs of distress discomfort or pain despite current medications - RN can pronounce death  Prolonged QT interval No further monitoring required per comfort care.  Hepatorenal syndrome as complication of care ([place]) .  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) Noted on CT abdomen pelvis in the ED. Patient decided to pursue only comfort measures and no further evaluation.  History of alcohol abuse Has as needed Ativan per comfort care.  This may be used for signs of withdrawal as well.  Alcoholic cirrhosis of liver with ascites ([place]) With severe abdominal distention and discomfort related to this.   Palliative paracentesis ordered. No further evaluation per comfort care.  AKI (acute kidney injury) ([place]) Presented with creatinine of 2.46.   No baseline available.  Shortness of breath Comfort care per orders including morphine infusion.         Procedures: none  Consultations: none  The results of significant  diagnostics from this hospitalization (including imaging, microbiology, ancillary and laboratory) are listed below for reference.   Significant Diagnostic Studies: CT ABDOMEN  PELVIS WO CONTRAST  Result Date: 06/19/2021 CLINICAL DATA:  Abdominal pain and swelling.  Jaundice. * Tracking Code: BO * EXAM: CT ABDOMEN AND PELVIS WITHOUT CONTRAST TECHNIQUE: Multidetector CT imaging of the abdomen and pelvis was performed following the standard protocol without IV contrast. RADIATION DOSE REDUCTION: This exam was performed according to the departmental dose-optimization program which includes automated exposure control, adjustment of the mA and/or kV according to patient size and/or use of iterative reconstruction technique. COMPARISON:  None Available. FINDINGS: Lower chest: Numerous small pulmonary nodules are seen in visualized portions of both lung bases, consistent with pulmonary metastases. Hepatobiliary: Numerous low-attenuation masses are seen throughout the liver, some of which show partial calcification. Differential diagnosis includes liver metastases and multifocal hepatocellular carcinoma. Gallbladder is unremarkable. No evidence of biliary ductal dilatation. Pancreas: No mass or inflammatory process visualized on this unenhanced exam. Spleen:  Within normal limits in size. Adrenals/Urinary tract: No evidence of urolithiasis or hydronephrosis. Unremarkable unopacified urinary bladder. Stomach/Bowel: No evidence of obstruction, inflammatory process, or abnormal fluid collections. Vascular/Lymphatic: No pathologically enlarged lymph nodes identified. No evidence of abdominal aortic aneurysm. Aortic atherosclerotic calcification incidentally noted. Reproductive:  No mass or other significant abnormality. Other: Large amount of ascites is seen throughout the abdomen and pelvis. A small left inguinal hernia is also seen containing ascites. Diffuse body wall edema also noted. Musculoskeletal:  No suspicious bone lesions identified. IMPRESSION: Numerous masses throughout the liver, some of which show partial calcification. Differential diagnosis includes liver metastases and multifocal  hepatocellular carcinoma. Large amount of ascites. Small left inguinal hernia also seen which contains ascites. Numerous small pulmonary metastases in both lung bases. Electronically Signed   By: Marlaine Hind M.D.   On: 06/18/2021 18:12   CT HEAD WO CONTRAST (5MM)  Result Date: 06/17/2021 CLINICAL DATA:  61 year old male with altered mental status. EXAM: CT HEAD WITHOUT CONTRAST TECHNIQUE: Contiguous axial images were obtained from the base of the skull through the vertex without intravenous contrast. RADIATION DOSE REDUCTION: This exam was performed according to the departmental dose-optimization program which includes automated exposure control, adjustment of the mA and/or kV according to patient size and/or use of iterative reconstruction technique. COMPARISON:  08/03/2005 FINDINGS: Brain: No evidence of acute infarction, hemorrhage, hydrocephalus, extra-axial collection or mass lesion/mass effect. Vascular: Carotid atherosclerotic calcifications are noted. Skull: Normal. Negative for fracture or focal lesion. Sinuses/Orbits: No acute finding. Other: None. IMPRESSION: No evidence of acute intracranial abnormality. Electronically Signed   By: Margarette Canada M.D.   On: 06/27/2021 18:14   US Venous Img Lower Bilateral  Result Date: 06/18/2021 CLINICAL DATA:  Leg swelling and pitting edema. EXAM: BILATERAL LOWER EXTREMITY VENOUS DOPPLER ULTRASOUND TECHNIQUE: Gray-scale sonography with graded compression, as well as color Doppler and duplex ultrasound were performed to evaluate the lower extremity deep venous systems from the level of the common femoral vein and including the common femoral, femoral, profunda femoral, popliteal and calf veins including the posterior tibial, peroneal and gastrocnemius veins when visible. The superficial great saphenous vein was also interrogated. Spectral Doppler was utilized to evaluate flow at rest and with distal augmentation maneuvers in the common femoral, femoral and  popliteal veins. COMPARISON:  None Available. FINDINGS: RIGHT LOWER EXTREMITY Common Femoral Vein: No evidence of thrombus. Normal compressibility, respiratory phasicity and response to augmentation. Saphenofemoral Junction: No evidence of thrombus. Normal compressibility and flow on  color Doppler imaging. Profunda Femoral Vein: No evidence of thrombus. Normal compressibility and flow on color Doppler imaging. Femoral Vein: No evidence of thrombus. Normal compressibility, respiratory phasicity and response to augmentation. Popliteal Vein: No evidence of thrombus. Normal compressibility, respiratory phasicity and response to augmentation. Calf Veins: No evidence of thrombus. Normal compressibility and flow on color Doppler imaging. Superficial Great Saphenous Vein: No evidence of thrombus. Normal compressibility. Venous Reflux:  None. Other Findings:  None. LEFT LOWER EXTREMITY Common Femoral Vein: No evidence of thrombus. Normal compressibility, respiratory phasicity and response to augmentation. Saphenofemoral Junction: No evidence of thrombus. Normal compressibility and flow on color Doppler imaging. Profunda Femoral Vein: No evidence of thrombus. Normal compressibility and flow on color Doppler imaging. Femoral Vein: No evidence of thrombus. Normal compressibility, respiratory phasicity and response to augmentation. Popliteal Vein: No evidence of thrombus. Normal compressibility, respiratory phasicity and response to augmentation. Calf Veins: No evidence of thrombus. Normal compressibility and flow on color Doppler imaging. Superficial Great Saphenous Vein: No evidence of thrombus. Normal compressibility. Venous Reflux:  None. Other Findings:  None. IMPRESSION: No evidence of deep venous thrombosis in either lower extremity. Electronically Signed   By: Zerita Boers M.D.   On: 06/17/2021 20:12   DG Chest Portable 1 View  Result Date: 06/11/2021 CLINICAL DATA:  Chest pain and shortness of breath. Abdominal  pain and jaundice. EXAM: PORTABLE CHEST 1 VIEW COMPARISON:  03/16/2010 FINDINGS: The heart size and mediastinal contours are within normal limits. Low lung volumes are seen. Subsegmental atelectasis is noted in the left lung base. No evidence of pulmonary consolidation or pleural effusion. IMPRESSION: Low lung volumes and left basilar subsegmental atelectasis. Electronically Signed   By: Marlaine Hind M.D.   On: 07/11/2021 17:07   US ABDOMEN LIMITED RUQ (LIVER/GB)  Result Date: 06/25/2021 CLINICAL DATA:  Right upper quadrant abdominal pain.  Cirrhosis. EXAM: ULTRASOUND ABDOMEN LIMITED RIGHT UPPER QUADRANT COMPARISON:  CT abdomen pelvis dated 06/15/2021. FINDINGS: Gallbladder: No gallstones or wall thickening visualized. No sonographic Murphy sign noted by sonographer. Common bile duct: Diameter: 4 mm Liver: The liver demonstrates a heterogeneous echotexture with multiple echogenic masses. A confluent of mass in the left lobe measure up to approximately 15 cm. There is lobulated liver contour in keeping with cirrhosis. Portal vein is patent on color Doppler imaging with normal direction of blood flow towards the liver. Other: Small ascites. IMPRESSION: 1. Cirrhosis with evidence of portal hypertension and ascites. 2. Multiple hepatic masses most suspicious for metastasis or hepatocellular carcinoma. 3. Patent main portal vein with hepatopetal flow. Electronically Signed   By: Anner Crete M.D.   On: 06/19/2021 19:17    Microbiology: Recent Results (from the past 240 hour(s))  Resp Panel by RT-PCR (Flu A&B, Covid) Nasopharyngeal Swab     Status: None   Collection Time: 07/10/2021  4:33 PM   Specimen: Nasopharyngeal Swab; Nasopharyngeal(NP) swabs in vial transport medium  Result Value Ref Range Status   SARS Coronavirus 2 by RT PCR NEGATIVE NEGATIVE Final    Comment: (NOTE) SARS-CoV-2 target nucleic acids are NOT DETECTED.  The SARS-CoV-2 RNA is generally detectable in upper respiratory specimens  during the acute phase of infection. The lowest concentration of SARS-CoV-2 viral copies this assay can detect is 138 copies/mL. A negative result does not preclude SARS-Cov-2 infection and should not be used as the sole basis for treatment or other patient management decisions. A negative result may occur with  improper specimen collection/handling, submission of specimen other than nasopharyngeal swab,  presence of viral mutation(s) within the areas targeted by this assay, and inadequate number of viral copies(<138 copies/mL). A negative result must be combined with clinical observations, patient history, and epidemiological information. The expected result is Negative.  Fact Sheet for Patients:  EntrepreneurPulse.com.au  Fact Sheet for Healthcare Providers:  IncredibleEmployment.be  This test is no t yet approved or cleared by the [place] FDA and  has been authorized for detection and/or diagnosis of SARS-CoV-2 by FDA under an Emergency Use Authorization (EUA). This EUA will remain  in effect (meaning this test can be used) for the duration of the COVID-19 declaration under Section 564(b)(1) of the Act, 21 U.S.C.section 360bbb-3(b)(1), unless the authorization is terminated  or revoked sooner.       Influenza A by PCR NEGATIVE NEGATIVE Final   Influenza B by PCR NEGATIVE NEGATIVE Final    Comment: (NOTE) The Xpert Xpress SARS-CoV-2/FLU/RSV plus assay is intended as an aid in the diagnosis of influenza from Nasopharyngeal swab specimens and should not be used as a sole basis for treatment. Nasal washings and aspirates are unacceptable for Xpert Xpress SARS-CoV-2/FLU/RSV testing.  Fact Sheet for Patients: EntrepreneurPulse.com.au  Fact Sheet for Healthcare Providers: IncredibleEmployment.be  This test is not yet approved or cleared by the [place] FDA and has been authorized for detection  and/or diagnosis of SARS-CoV-2 by FDA under an Emergency Use Authorization (EUA). This EUA will remain in effect (meaning this test can be used) for the duration of the COVID-19 declaration under Section 564(b)(1) of the Act, 21 U.S.C. section 360bbb-3(b)(1), unless the authorization is terminated or revoked.  Performed at [facility], [address] [place], [place] [address]     Time spent: 25 minutes  Signed: Ezekiel Slocumb, DO 07/11/21

## 2021-07-12 DEATH — deceased
